# Patient Record
Sex: Female | Born: 1965 | Race: White | Hispanic: No | Marital: Married | Smoking: Current every day smoker
Health system: Southern US, Community
[De-identification: ages and names within clinical notes are randomized; demographics above are authoritative.]

---

## 2000-11-07 ENCOUNTER — Other Ambulatory Visit: Admission: RE | Admit: 2000-11-07 | Discharge: 2000-11-07 | Payer: Self-pay | Admitting: Obstetrics and Gynecology

## 2002-04-01 ENCOUNTER — Other Ambulatory Visit: Admission: RE | Admit: 2002-04-01 | Discharge: 2002-04-01 | Payer: Self-pay | Admitting: Obstetrics and Gynecology

## 2006-05-23 ENCOUNTER — Other Ambulatory Visit: Admission: RE | Admit: 2006-05-23 | Discharge: 2006-05-23 | Payer: Self-pay | Admitting: Family Medicine

## 2009-06-05 ENCOUNTER — Emergency Department (HOSPITAL_COMMUNITY): Admission: EM | Admit: 2009-06-05 | Discharge: 2009-06-05 | Payer: Self-pay | Admitting: Emergency Medicine

## 2010-06-20 ENCOUNTER — Other Ambulatory Visit (HOSPITAL_COMMUNITY)
Admission: RE | Admit: 2010-06-20 | Discharge: 2010-06-20 | Disposition: A | Discharge status: Home / Self Care | Payer: 59 | Source: Ambulatory Visit | Attending: Family Medicine | Admitting: Family Medicine

## 2010-06-20 ENCOUNTER — Other Ambulatory Visit: Payer: Self-pay | Admitting: Family Medicine

## 2010-06-20 DIAGNOSIS — Z124 Encounter for screening for malignant neoplasm of cervix: Secondary | ICD-10-CM | POA: Insufficient documentation

## 2012-07-15 ENCOUNTER — Other Ambulatory Visit: Payer: Self-pay | Admitting: Family Medicine

## 2012-07-15 ENCOUNTER — Other Ambulatory Visit (HOSPITAL_COMMUNITY)
Admission: RE | Admit: 2012-07-15 | Discharge: 2012-07-15 | Disposition: A | Discharge status: Home / Self Care | Payer: 59 | Source: Ambulatory Visit | Attending: Family Medicine | Admitting: Family Medicine

## 2012-07-15 DIAGNOSIS — Z124 Encounter for screening for malignant neoplasm of cervix: Secondary | ICD-10-CM | POA: Insufficient documentation

## 2012-07-15 DIAGNOSIS — Z1151 Encounter for screening for human papillomavirus (HPV): Secondary | ICD-10-CM | POA: Insufficient documentation

## 2012-08-04 ENCOUNTER — Other Ambulatory Visit: Payer: Self-pay | Admitting: Obstetrics and Gynecology

## 2016-11-20 ENCOUNTER — Other Ambulatory Visit: Payer: Self-pay | Admitting: Obstetrics and Gynecology

## 2016-11-20 ENCOUNTER — Other Ambulatory Visit (HOSPITAL_COMMUNITY)
Admission: RE | Admit: 2016-11-20 | Discharge: 2016-11-20 | Disposition: A | Discharge status: Home / Self Care | Payer: BLUE CROSS/BLUE SHIELD | Source: Ambulatory Visit | Attending: Obstetrics and Gynecology | Admitting: Obstetrics and Gynecology

## 2016-11-20 DIAGNOSIS — Z30433 Encounter for removal and reinsertion of intrauterine contraceptive device: Secondary | ICD-10-CM | POA: Diagnosis not present

## 2016-11-20 DIAGNOSIS — Z124 Encounter for screening for malignant neoplasm of cervix: Secondary | ICD-10-CM | POA: Insufficient documentation

## 2016-11-20 DIAGNOSIS — Z01419 Encounter for gynecological examination (general) (routine) without abnormal findings: Secondary | ICD-10-CM | POA: Diagnosis not present

## 2016-11-22 LAB — CYTOLOGY - PAP
Diagnosis: NEGATIVE
HPV (WINDOPATH): NOT DETECTED

## 2016-12-12 ENCOUNTER — Other Ambulatory Visit: Payer: Self-pay | Admitting: Obstetrics and Gynecology

## 2016-12-12 DIAGNOSIS — Z1231 Encounter for screening mammogram for malignant neoplasm of breast: Secondary | ICD-10-CM

## 2016-12-20 ENCOUNTER — Ambulatory Visit
Admission: RE | Admit: 2016-12-20 | Discharge: 2016-12-20 | Disposition: A | Discharge status: Home / Self Care | Payer: BLUE CROSS/BLUE SHIELD | Source: Ambulatory Visit | Attending: Obstetrics and Gynecology | Admitting: Obstetrics and Gynecology

## 2016-12-20 ENCOUNTER — Encounter: Payer: Self-pay | Admitting: Radiology

## 2016-12-20 DIAGNOSIS — Z1231 Encounter for screening mammogram for malignant neoplasm of breast: Secondary | ICD-10-CM | POA: Diagnosis not present

## 2017-01-04 DIAGNOSIS — Z30431 Encounter for routine checking of intrauterine contraceptive device: Secondary | ICD-10-CM | POA: Diagnosis not present

## 2017-07-16 DIAGNOSIS — Z13220 Encounter for screening for lipoid disorders: Secondary | ICD-10-CM | POA: Diagnosis not present

## 2017-07-16 DIAGNOSIS — Z0000 Encounter for general adult medical examination without abnormal findings: Secondary | ICD-10-CM | POA: Diagnosis not present

## 2017-07-23 ENCOUNTER — Encounter: Payer: Self-pay | Admitting: Obstetrics and Gynecology

## 2017-07-23 DIAGNOSIS — R739 Hyperglycemia, unspecified: Secondary | ICD-10-CM | POA: Diagnosis not present

## 2017-08-08 ENCOUNTER — Encounter: Payer: BLUE CROSS/BLUE SHIELD | Attending: Obstetrics and Gynecology

## 2017-08-08 DIAGNOSIS — R7303 Prediabetes: Secondary | ICD-10-CM

## 2017-08-08 DIAGNOSIS — Z6828 Body mass index (BMI) 28.0-28.9, adult: Secondary | ICD-10-CM | POA: Insufficient documentation

## 2017-08-08 DIAGNOSIS — Z713 Dietary counseling and surveillance: Secondary | ICD-10-CM | POA: Insufficient documentation

## 2017-08-08 NOTE — Patient Instructions (Addendum)
-   Great job with activity, keep it up and incorporate new exercises as desired - Try frozen vegetable packs, make a side for yourself at meal time - Create a balanced plate, incorporating all food groups - Try hard boiled "baked" eggs in the oven. 350 degrees for 20-30 minutes, then put in ice bath.  - Slow down with meal times, enjoy your food and do not eat past capacity - Add more water throughout your day - Attempt to make 1/2 of grain choices whole grain  - Choose heart-healthy unsaturated fats. Limit saturated fats, trans fats, and cholesterol intake.

## 2017-08-08 NOTE — Progress Notes (Signed)
Medical Nutrition Therapy:  Appt start time: 0800 end time:  0845.  Assessment:  Primary concerns today: Pt referred for prediabetes. Pt is not currently on medication and hopes to stay that way. Pt with high glucose and cholesterol lab values and interested in ways to improve overall health. Pt has started to incorporate more activity into her days but sometimes lacks motivation. Pt states her husband is picky so sometimes impacts what she is able to eat at meal times. Pt's children have been asking her to stop drinking Diet Coke and plan to get her a Fitbit to increase her activity level.   Preferred Learning Style:   No preference indicated   Learning Readiness:  Ready  Change in progress  MEDICATIONS: None recorded   DIETARY INTAKE:  Usual eating pattern includes 3 meals and 1-2 snacks per day.  Everyday foods include bread.  Avoided foods include N/A.    24-hr recall:  B ( AM): 2 pieces white toast with peanut butter Snk ( AM): None reported   L ( PM): Leftover ham with potato salad Snk ( PM): None reported D ( PM): 6 oz steak and salad (ranch, tomato, cucumber, onion, bacon bits) Snk ( PM): 2 handfuls of pretzels  (10:00 PM) Beverages: 3 Diet Cokes, 2 bottles of water, occasional beer on weekends  Usual physical activity: walk a mile at least 4 times per week  Progress Towards Goal(s):  In progress.   Nutritional Diagnosis:  NB-1.1 Food and nutrition-related knowledge deficit As related to limited prior knowledge.  As evidenced by pt report, new diagnosed prediabetes.    Intervention:  Nutrition education provided. Discussed what diabetes is and the effect it has on overall health. Described the three types of diabetes, pt has hx of gestational diabetes. Promoted well balanced meals and slowing down at meal times. Offered a few recipe ideas. Discussed ways to lower cholesterol levels. Encouraged ongoing exercise for overall health.   Tips: - Great job with activity,  keep it up and incorporate new exercises as desired - Try frozen vegetable packs, make a side for yourself at meal time - Create a balanced plate, incorporating all food groups - Slow down with meal times - Add more water throughout your day - Attempt to make 1/2 of grain choices whole grain  - Choose heart-healthy unsaturated fats. Limit saturated fats, trans fats, and cholesterol intake.  Teaching Method Utilized: Visual Auditory  Handouts given during visit include:  Living Well booklet  MyPlate handout  Yellow card   Barriers to learning/adherence to lifestyle change: N/A  Demonstrated degree of understanding via:  Teach Back   Monitoring/Evaluation:  Dietary intake, exercise, and body weight prn.

## 2017-11-11 ENCOUNTER — Other Ambulatory Visit: Payer: Self-pay | Admitting: Obstetrics and Gynecology

## 2017-11-11 DIAGNOSIS — Z1231 Encounter for screening mammogram for malignant neoplasm of breast: Secondary | ICD-10-CM

## 2017-12-26 DIAGNOSIS — Z01419 Encounter for gynecological examination (general) (routine) without abnormal findings: Secondary | ICD-10-CM | POA: Diagnosis not present

## 2018-01-07 ENCOUNTER — Ambulatory Visit
Admission: RE | Admit: 2018-01-07 | Discharge: 2018-01-07 | Disposition: A | Discharge status: Home / Self Care | Payer: BLUE CROSS/BLUE SHIELD | Source: Ambulatory Visit | Attending: Obstetrics and Gynecology | Admitting: Obstetrics and Gynecology

## 2018-01-07 DIAGNOSIS — Z1231 Encounter for screening mammogram for malignant neoplasm of breast: Secondary | ICD-10-CM

## 2019-02-03 ENCOUNTER — Other Ambulatory Visit: Payer: Self-pay

## 2019-02-03 ENCOUNTER — Ambulatory Visit: Payer: 59 | Attending: Family Medicine | Admitting: Physical Therapy

## 2019-02-03 ENCOUNTER — Encounter: Payer: Self-pay | Admitting: Physical Therapy

## 2019-02-03 DIAGNOSIS — R293 Abnormal posture: Secondary | ICD-10-CM | POA: Diagnosis present

## 2019-02-03 DIAGNOSIS — G8929 Other chronic pain: Secondary | ICD-10-CM | POA: Diagnosis present

## 2019-02-03 DIAGNOSIS — M6281 Muscle weakness (generalized): Secondary | ICD-10-CM | POA: Diagnosis present

## 2019-02-03 DIAGNOSIS — M25612 Stiffness of left shoulder, not elsewhere classified: Secondary | ICD-10-CM | POA: Diagnosis present

## 2019-02-03 DIAGNOSIS — M25512 Pain in left shoulder: Secondary | ICD-10-CM | POA: Diagnosis not present

## 2019-02-03 NOTE — Patient Instructions (Signed)
Access Code: [email or web address]  URL: [email or web address]  Date: 02/03/2019  Prepared by: Jari Favre   Exercises  Seated Scapular Retraction - 10 reps - 3 sets - 5 sec hold - 1x daily - 7x weekly  Patient Education  Office Posture

## 2019-02-03 NOTE — Therapy (Addendum)
[facility] [address], [place], [place], [address] Phone: [phone]   Fax:  [phone]  Physical Therapy Evaluation  Patient Details  Name: Heather Ford MRN: [id number] Date of Birth: 07-22-1965 Referring Provider (PT): Lujean Amel, MD   Encounter Date: 02/03/2019  PT End of Session - 02/03/19 0925    Visit Number  1    Date for PT Re-Evaluation  03/31/19    PT Start Time  1103    PT Stop Time  0923    PT Time Calculation (min)  36 min    Activity Tolerance  Patient tolerated treatment well;Patient limited by pain    Behavior During Therapy  [facility] for tasks assessed/performed       History reviewed. No pertinent past medical history.  History reviewed. No pertinent surgical history.  There were no vitals filed for this visit.   Subjective Assessment - 02/03/19 0856    Subjective  Pt states she has had pain that has gotten worse over the last couple month.    Limitations  Lifting;House hold activities;Other (comment)    Patient Stated Goals  Be able to reach and not have pain    Currently in Pain?  Yes   when lifting arm   Pain Score  5    7/10 mornings and evenings   Pain Location  Shoulder    Pain Orientation  Left;Proximal    Pain Descriptors / Indicators  Shooting;Sharp    Pain Type  Chronic pain    Pain Radiating Towards  down lateral arm into the elbow sometimes    Pain Onset  More than a month ago    Pain Frequency  Intermittent    Aggravating Factors   reaching in any direction back or overhead    Pain Relieving Factors  advil/tylenol or roll on biofreeze type of thing    Effect of Pain on Daily Activities  able to do everything but shoulder is aggravated    Multiple Pain Sites  No         [facility] PT Assessment - 02/03/19 0001      Assessment   Medical Diagnosis  M25.512 (ICD-10-CM) - Pain in left shoulder    Referring Provider (PT)  Koirala, Dibas, MD    Onset Date/Surgical Date   --   over 3 months insidious onset   Hand Dominance  Right    Prior Therapy  No      Precautions   Precautions  None      Restrictions   Weight Bearing Restrictions  No      Balance Screen   Has the patient fallen in the past 6 months  No      [place] residence    Living Arrangements  Spouse/significant other      Prior Function   Level of Independence  Independent    Vocation  Full time employment    Vocation Requirements  works from home, 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      Cognition   Overall Cognitive Status  Within Functional Limits for tasks assessed      Observation/Other Assessments   Focus on Therapeutic Outcomes (FOTO)   40% limited      Posture/Postural Control   Posture/Postural Control  Postural limitations    Postural Limitations  Rounded Shoulders;Increased thoracic kyphosis      ROM / Strength   AROM / PROM / Strength  AROM;Strength      AROM   AROM Assessment Site  Shoulder    Right/Left Shoulder  Right;Left    Right Shoulder Extension  79 Degrees    Right Shoulder Flexion  155 Degrees    Right Shoulder ABduction  155 Degrees    Right Shoulder Internal Rotation  --   lateral hip   Left Shoulder Extension  56 Degrees    Left Shoulder Flexion  130 Degrees    Left Shoulder ABduction  96 Degrees    Left Shoulder Internal Rotation  --   T6/7     Strength   Overall Strength Comments  Lt shoulder abd 3/5 +pain, IR 4-/5 +pain, flex 4/5 +pain      Palpation   Palpation comment  left pecs and anterior deltoid tight and tender ; A/P hypomobility      Ambulation/Gait   Gait Pattern  Within Functional Limits                Objective measurements completed on examination: See above findings.      [facility] Adult PT Treatment/Exercise - 02/03/19 0001      Self-Care   Self-Care  Other Self-Care Comments    Other Self-Care Comments   initial HEP             PT  Education - 02/03/19 0924    Education Details  Access Code: [email or web address]    Person(s) Educated  Patient    Methods  Explanation;Demonstration;Handout;Verbal cues    Comprehension  Verbalized understanding;Returned demonstration       PT Short Term Goals - 02/03/19 1401      PT SHORT TERM GOAL #1   Title  ind with initial HEP    Time  4    Period  Weeks    Status  New    Target Date  03/03/19      PT SHORT TERM GOAL #2   Title  AROM Lt shoulder flexion 140 deg and abduction 120 deg    Time  4    Period  Weeks    Status  New    Target Date  03/03/19        PT Long Term Goals - 02/03/19 1402      PT LONG TERM GOAL #1   Title  ind with advanced HEP    Time  8    Period  Weeks    Status  New    Target Date  03/31/19      PT LONG TERM GOAL #2   Title  FOTO < or = to 28% limitied.    Time  8    Period  Weeks    Status  New    Target Date  03/31/19      PT LONG TERM GOAL #3   Title  Pt will report at least 75% less pain during typical daily activities    Time  8    Period  Weeks    Status  New    Target Date  03/31/19      PT LONG TERM GOAL #4   Title  Pt will demonstrate Lt shoulder internal rotation to at least T12 in order to reach behind her to fasten her bra    Time  8    Period  Weeks    Status  New    Target Date  03/31/19      PT LONG TERM GOAL #5   Title  Pt will demonstrate  at least 4+/5 Left shoulder strength in order to safely lift boxes for job related tasks    Time  8    Period  Weeks    Status  New    Target Date  03/31/19             Plan - 02/03/19 1414    Clinical Impression Statement  Pt is friendly 53 y/o female who has had Lt shoulder pain for over 3 months, but it has recently been worsening.  Pt is right side dominent.  Pt has limited AROM as mentioned above. She has decreased strength and pain while doing Left shoulder flexion, abduction and IR.  Pt has increased rounding of shoulder and thoracic kyphosis.  Pt is positive for  shoulder Lt shoulder impingement based on Neer and Hawkins-Kennedy tests . She has muscle spasm and tenderness with palpation of RTC attachments.  pt will benefit from skilled PT to address impairments so she can safely perform her job related and house hold tasks with reduced risk of injury.    Stability/Clinical Decision Making  Evolving/Moderate complexity    Clinical Decision Making  Low    Rehab Potential  Excellent    PT Frequency  2x / week    PT Duration  8 weeks    PT Treatment/Interventions  ADLs/Self Care Home Management;Biofeedback;Cryotherapy;Electrical Stimulation;Iontophoresis 68m/ml Dexamethasone;Moist Heat;Ultrasound;Therapeutic activities;Therapeutic exercise;Manual techniques;Patient/family education;Passive range of motion;Neuromuscular re-education;Dry needling;Taping    PT Next Visit Plan  ionto if order signed (provide education handout to patient), discuss DN if appropriate, RTC and posture strengthening, scap stability    PT Home Exercise Plan  Access Code: [email or web address]: [email or web address] Date: 02/03/2019 Prepared by: JJari Favre Exercises Seated Scapular Retraction - 10 reps - 3 sets - 5 sec hold - 1x daily - 7x weekly Patient Education .Office Posture    Consulted and Agree with Plan of Care  Patient       Patient will benefit from skilled therapeutic intervention in order to improve the following deficits and impairments:  Hypomobility, Decreased strength, Decreased range of motion, Postural dysfunction, Pain, Impaired UE functional use  Visit Diagnosis: Chronic left shoulder pain  Stiffness of left shoulder, not elsewhere classified  Muscle weakness (generalized)  Abnormal posture     Problem List There are no active problems to display for this patient.   JJule Ser PT 02/03/2019, 2:24 PM  [facility] [address] [place] [address]Phone:  [phone]  Fax:  [phone] Name: Heather JOLLIFFEMRN: [id number] of Birth: 304-15-1967   PHYSICAL THERAPY DISCHARGE SUMMARY  Visits from Start of Care: 1  Current functional level related to goals / functional outcomes: See above goals   Remaining deficits: See above   Education / Equipment: HEP  Plan: Patient agrees to discharge.  Patient goals were not met. Patient is being discharged due to not returning since the last visit.  ?????    Did not return due to cost, eval only   JGustavus Bryant PT 02/24/19 9:03 AM

## 2019-02-10 ENCOUNTER — Ambulatory Visit: Payer: 59 | Admitting: Physical Therapy

## 2019-02-11 ENCOUNTER — Ambulatory Visit: Payer: 59 | Admitting: Physical Therapy

## 2019-02-16 ENCOUNTER — Encounter: Payer: 59 | Admitting: Physical Therapy

## 2019-02-17 ENCOUNTER — Other Ambulatory Visit: Payer: Self-pay | Admitting: Family Medicine

## 2019-02-17 DIAGNOSIS — Z1231 Encounter for screening mammogram for malignant neoplasm of breast: Secondary | ICD-10-CM

## 2019-02-18 ENCOUNTER — Other Ambulatory Visit: Payer: Self-pay

## 2019-02-18 ENCOUNTER — Ambulatory Visit
Admission: RE | Admit: 2019-02-18 | Discharge: 2019-02-18 | Disposition: A | Discharge status: Home / Self Care | Payer: 59 | Source: Ambulatory Visit | Attending: Family Medicine | Admitting: Family Medicine

## 2019-02-18 DIAGNOSIS — Z1231 Encounter for screening mammogram for malignant neoplasm of breast: Secondary | ICD-10-CM

## 2019-02-19 ENCOUNTER — Encounter: Payer: 59 | Admitting: Physical Therapy

## 2019-02-24 ENCOUNTER — Encounter: Payer: 59 | Admitting: Physical Therapy

## 2019-02-24 ENCOUNTER — Other Ambulatory Visit: Payer: Self-pay

## 2019-02-24 DIAGNOSIS — Z20822 Contact with and (suspected) exposure to covid-19: Secondary | ICD-10-CM

## 2019-02-25 LAB — NOVEL CORONAVIRUS, NAA: SARS-CoV-2, NAA: NOT DETECTED

## 2019-02-26 ENCOUNTER — Encounter: Payer: 59 | Admitting: Physical Therapy

## 2019-03-03 ENCOUNTER — Encounter: Payer: 59 | Admitting: Physical Therapy

## 2019-03-05 ENCOUNTER — Encounter: Payer: 59 | Admitting: Physical Therapy

## 2020-02-17 IMAGING — MG DIGITAL SCREENING BILAT W/ TOMO W/ CAD
8 series · 8 of 24 positions shown · non-contrast
Comparison: Previous exam(s).

CLINICAL DATA: Screening.

EXAM:
DIGITAL SCREENING BILATERAL MAMMOGRAM WITH TOMO AND CAD

[L CC synth-2D]
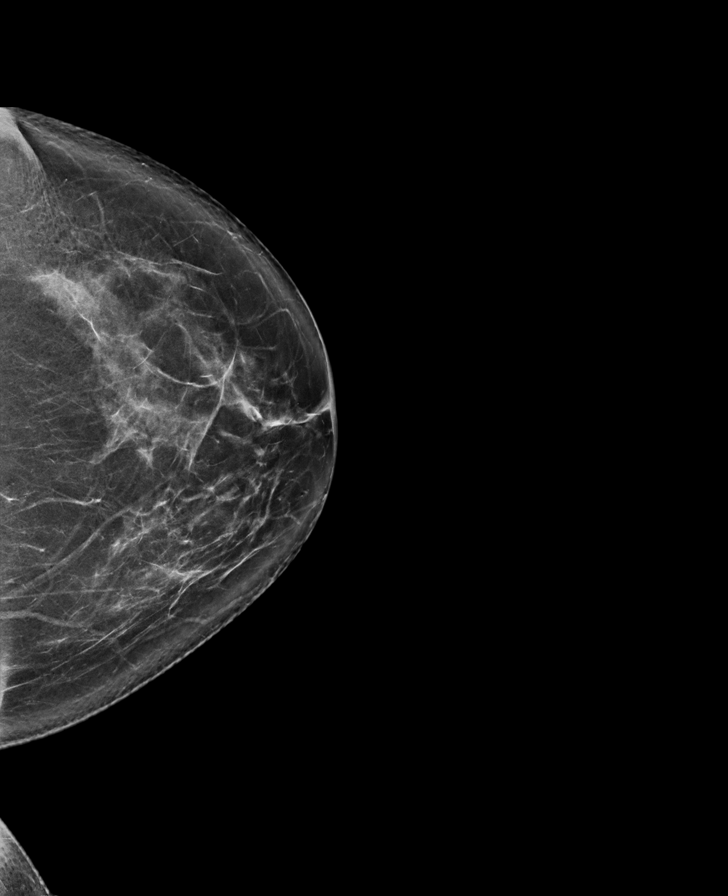

[R MLO synth-2D]
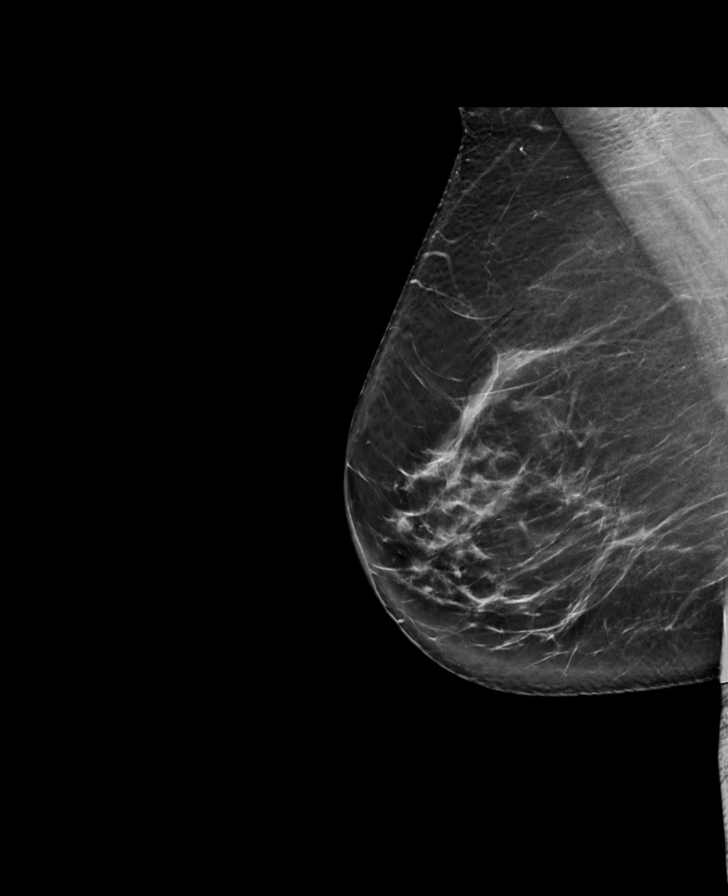

[R CC synth-2D]
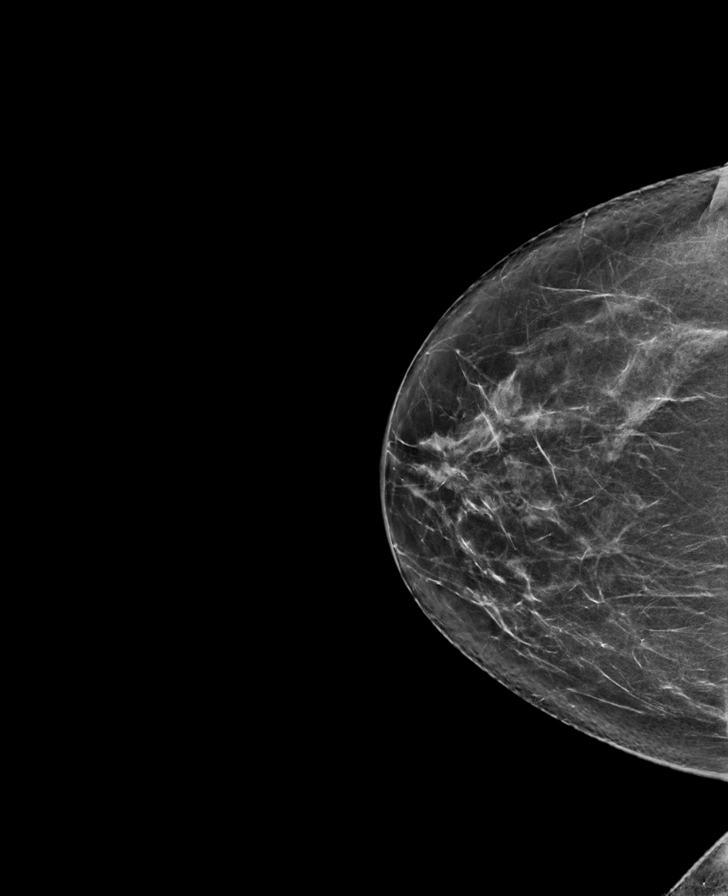

[L MLO synth-2D]
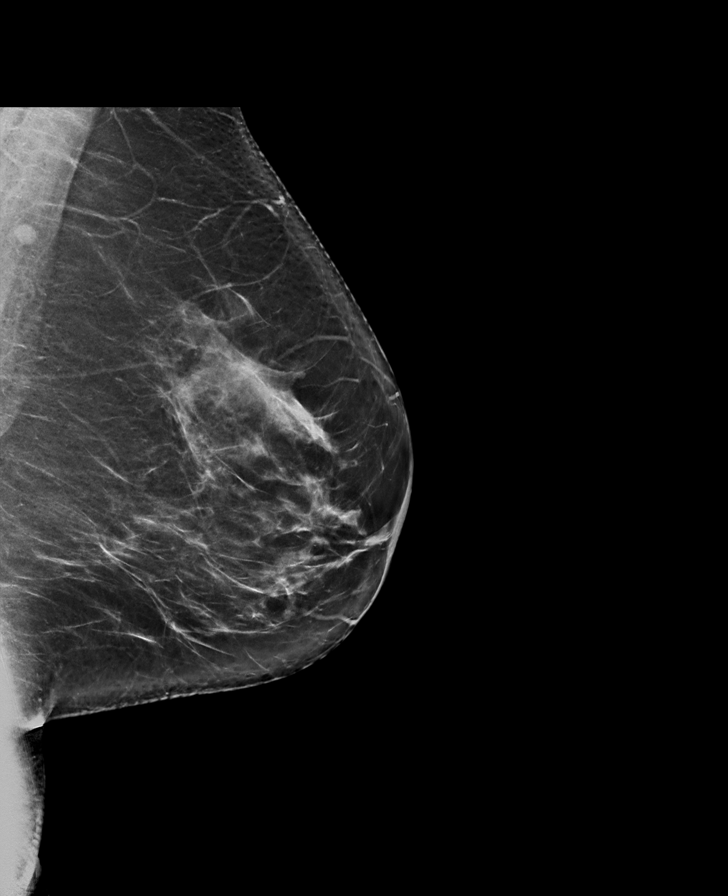

[R CC tomo · tomo slice 39/78.0]
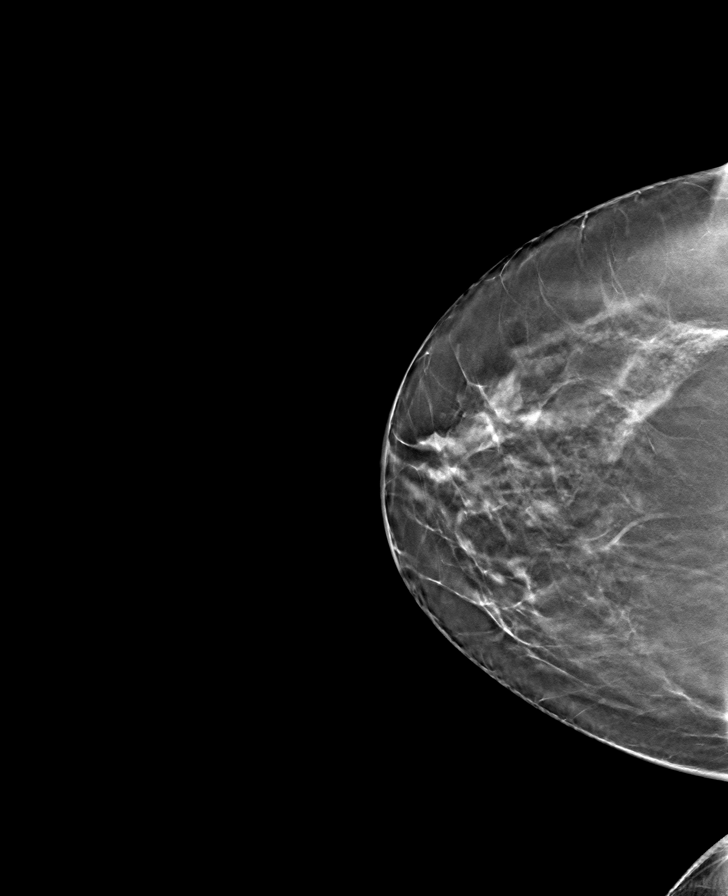

[L CC tomo · tomo slice 43/86.0]
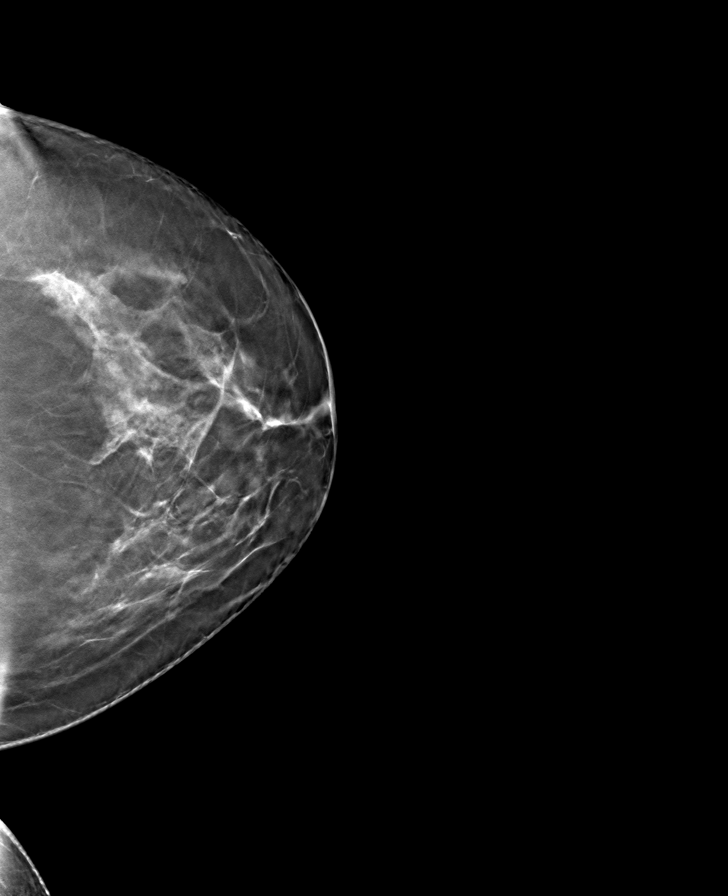

[L MLO tomo · tomo slice 43/85.0]
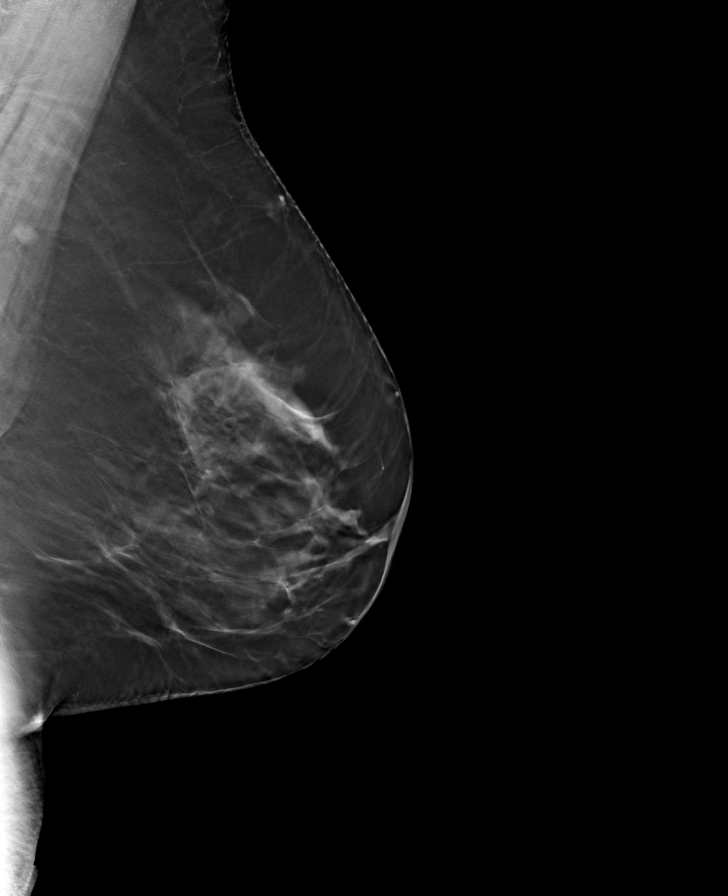

[R MLO tomo · tomo slice 45/89.0]
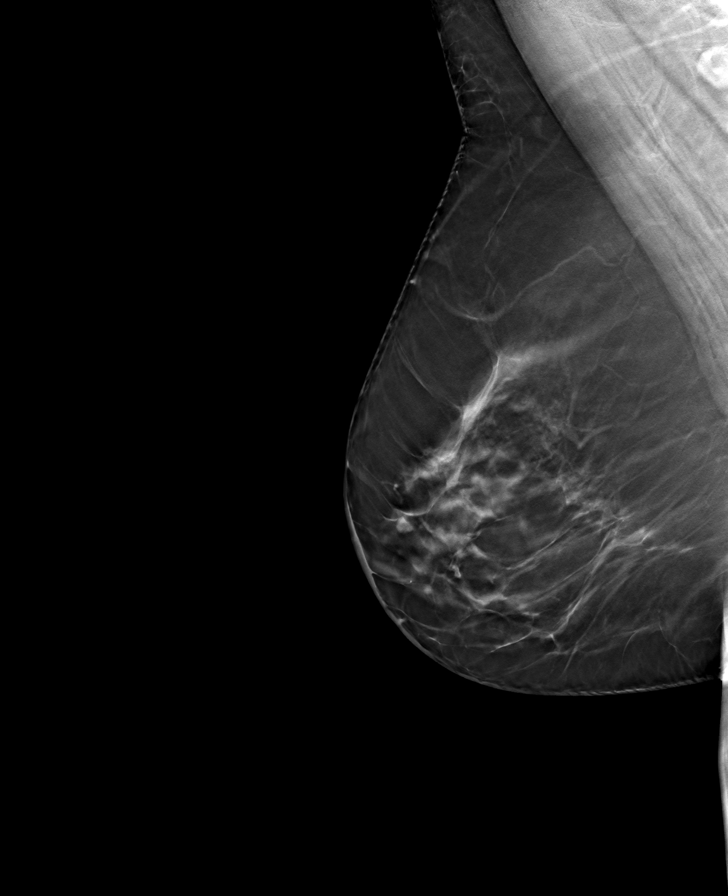

[8 of 24 positions shown; findings below may reference images not displayed]

ACR Breast Density Category b: There are scattered areas of
fibroglandular density.
FINDINGS: There are no findings suspicious for malignancy. Images were
processed with CAD.
IMPRESSION: No mammographic evidence of malignancy. A result letter of this
screening mammogram will be mailed directly to the patient.

RECOMMENDATION:
Screening mammogram in one year. (Code:[id number])

BI-RADS CATEGORY  1: Negative.

## 2021-10-02 DIAGNOSIS — E7800 Pure hypercholesterolemia, unspecified: Secondary | ICD-10-CM | POA: Diagnosis not present

## 2021-10-02 DIAGNOSIS — R7303 Prediabetes: Secondary | ICD-10-CM | POA: Diagnosis not present

## 2021-10-02 DIAGNOSIS — Z0000 Encounter for general adult medical examination without abnormal findings: Secondary | ICD-10-CM | POA: Diagnosis not present

## 2021-11-14 DIAGNOSIS — F17210 Nicotine dependence, cigarettes, uncomplicated: Secondary | ICD-10-CM | POA: Diagnosis not present

## 2021-11-14 DIAGNOSIS — L03211 Cellulitis of face: Secondary | ICD-10-CM | POA: Diagnosis not present

## 2021-11-14 DIAGNOSIS — J019 Acute sinusitis, unspecified: Secondary | ICD-10-CM | POA: Diagnosis not present

## 2022-06-28 ENCOUNTER — Other Ambulatory Visit: Payer: Self-pay | Admitting: Nurse Practitioner

## 2022-06-28 ENCOUNTER — Other Ambulatory Visit (HOSPITAL_COMMUNITY)
Admission: RE | Admit: 2022-06-28 | Discharge: 2022-06-28 | Disposition: A | Discharge status: Home / Self Care | Payer: Commercial Managed Care - HMO | Source: Ambulatory Visit | Attending: Nurse Practitioner | Admitting: Nurse Practitioner

## 2022-06-28 DIAGNOSIS — Z124 Encounter for screening for malignant neoplasm of cervix: Secondary | ICD-10-CM | POA: Insufficient documentation

## 2022-07-05 LAB — CYTOLOGY - PAP
Comment: NEGATIVE
Diagnosis: NEGATIVE
High risk HPV: NEGATIVE

## 2022-10-23 ENCOUNTER — Ambulatory Visit: Payer: Commercial Managed Care - HMO | Admitting: Podiatry

## 2022-10-23 ENCOUNTER — Encounter: Payer: Self-pay | Admitting: Podiatry

## 2022-10-23 VITALS — BP 117/60 | HR 77

## 2022-10-23 DIAGNOSIS — M21961 Unspecified acquired deformity of right lower leg: Secondary | ICD-10-CM

## 2022-10-23 DIAGNOSIS — M779 Enthesopathy, unspecified: Secondary | ICD-10-CM

## 2022-10-23 DIAGNOSIS — L84 Corns and callosities: Secondary | ICD-10-CM | POA: Diagnosis not present

## 2022-10-23 DIAGNOSIS — M21962 Unspecified acquired deformity of left lower leg: Secondary | ICD-10-CM

## 2022-10-23 NOTE — Progress Notes (Signed)
Subjective:  Patient ID: Heather Ford, female    DOB: February 18, 1966,  MRN: [id number]  Chief Complaint  Patient presents with   Callouses    "I have this hard spot on the bottom of both feet." N - hard spot L - bilateral 3rd Met. D - 10 years O - gradually gotten worse C - sharp pain, tender, sore A - walking T - Saw a Podiatrist several years ago   Patient presents today with concern of painful skin lesions on the plantar aspect of both feet.  She states this has been an ongoing issue for many years.  She gets pedicures monthly and they will shave these areas for her to give her some relief.  She also notes that she does shave them occasionally at home to.  Denies stepping on a foreign object.  States that they do not drain or bleed.  The right foot is more painful than the left.  She also has a callus on the right great toe.  PCP is Heather Rankins, MD.  No past medical history on file.  No past surgical history on file.  Allergies  Allergen Reactions   Tetanus Antitoxin     fainted    Objective:  Vitals:   10/23/22 0914  BP: 117/60  Pulse: 77    Heather Ford is a pleasant 57 y.o. female in NAD. AAO x 3.  Vascular Examination: Capillary refill time is 3-5 seconds to toes bilateral. Palpable pedal pulses b/l LE. Digital hair present b/l. No pedal edema b/l. Skin temperature gradient WNL b/l. No varicosities b/l. No cyanosis or clubbing noted b/l.   Dermatological Examination: Pedal skin with normal turgor, texture and tone b/l. No open wounds. No interdigital macerations b/l.   Hyperkeratotic lesion present, with pain on palpation, located submet 3 bilateral.  No open lesions noted.  There is also a hyperkeratotic lesion on the plantar medial aspect of the hallux IPJ.  There is a plantarflexed third metatarsal bilateral.  Neurological Examination: Protective sensation intact with Semmes-Weinstein 10 gram monofilament b/l LE. Vibratory sensation intact b/l  LE.  Assessment/Plan: 1.  Callus bilateral. 2.  Metatarsal deformity bilateral.   DG FOOT COMPLETE LEFT DG FOOT COMPLETE RIGHT  The hyperkeratotic lesions were sharply debrided/shaved with sterile #313 blade.  Salinocaine and Band-Aids were applied to the areas.  She will remove this later today.  The right hallux callus was also shaved uneventfully.  She will continue to get pedicures to keep this under good control.  Discussed with the patient what is causing the corns/calluses and reviewed treatment options today, including shaving the the painful lesion(s), off-loading techniques and pads, custom orthotics / shoe modifications.   Discussed custom orthotics in detail with the patient today.  She can call her insurance to find out her coverage for custom orthotics and can call the office at any time to schedule a 3D scan of her feet.  She will need the submet 3 lesions offloaded in the orthotic to help decrease pain and decrease callus buildup.  She will consider this in the fall when she goes back to closed toe shoes.  She notes her sandals are pretty comfortable for the summer.  Recommended urea 40 cream +2% salicylic acid to be applied to the skin lesions nightly for management of the calluses.  Return in about 3 months (around 01/23/2023) for callus recheck and discuss orthotics for Fall.   Clerance Lav, DPM, FACFAS [facility]     [address] [place], [place] [address]                Office [phone]  Fax [phone]

## 2023-02-04 ENCOUNTER — Ambulatory Visit (INDEPENDENT_AMBULATORY_CARE_PROVIDER_SITE_OTHER): Payer: Commercial Managed Care - HMO | Admitting: Podiatry

## 2023-02-04 DIAGNOSIS — M21961 Unspecified acquired deformity of right lower leg: Secondary | ICD-10-CM | POA: Diagnosis not present

## 2023-02-04 DIAGNOSIS — L84 Corns and callosities: Secondary | ICD-10-CM | POA: Insufficient documentation

## 2023-02-04 DIAGNOSIS — M21962 Unspecified acquired deformity of left lower leg: Secondary | ICD-10-CM | POA: Diagnosis not present

## 2023-02-04 NOTE — Progress Notes (Signed)
   Chief Complaint  Patient presents with   Callouses    Painful calluses submet 3 bilateral.  And right hallux medial border.  Also interested in discussing orthotics-  will check her insurance first to see if they will pay.     HPI: 57 y.o. female presents today for follow up of painful plantar calluses.  She got the Urea 40% cream, but hasn't stayed compliant with applying this daily.  Overall, she notes they are a little better.  She is interested in checking orthotic coverage with her insurance.    No past medical history on file.  No past surgical history on file.  Allergies  Allergen Reactions   Tetanus Antitoxin     fainted    Physical Exam: Hyperkeratotic lesions noted on the plantar medial aspect the hallux IPJ, left submet 3 and right submet 4.  There are focal corns within the calluses submet 3 and submet 4.  No open lesions are noted.  No drainage is noted.  Palpable pedal pulses bilateral.  Assessment/Plan of Care: 1. Callus of foot   2. Metatarsal deformity, left   3. Metatarsal deformity, right     Discussed clinical findings with patient today.  Patient was given the orthotic estimate form with the diagnoses March so that she can call her insurance and check on her specific coverage for these items.  If they are covered she was to proceed with them, she will call our office and make an appointment with our pedorthist to be scanned for the custom orthotics.  She will need this high pressure areas on the plantar aspect of the forefoot offloaded with the orthotics.  The hyperkeratotic lesions were shaved with sterile #313 blade today.  Salinocaine ointment and Band-Aids were applied.  She will remove this at bedtime.  Follow-up as needed   Clerance Lav, DPM, FACFAS [facility]     [address] [place], [place] [address]                Office [phone]  Fax [phone]

## 2023-05-22 ENCOUNTER — Other Ambulatory Visit: Payer: Self-pay | Admitting: Family Medicine

## 2023-05-22 DIAGNOSIS — Z1231 Encounter for screening mammogram for malignant neoplasm of breast: Secondary | ICD-10-CM

## 2023-05-24 ENCOUNTER — Other Ambulatory Visit: Payer: Self-pay | Admitting: Family Medicine

## 2023-05-24 DIAGNOSIS — N644 Mastodynia: Secondary | ICD-10-CM

## 2023-05-30 ENCOUNTER — Other Ambulatory Visit: Payer: Self-pay | Admitting: Family Medicine

## 2023-05-30 DIAGNOSIS — N644 Mastodynia: Secondary | ICD-10-CM

## 2023-06-06 ENCOUNTER — Ambulatory Visit
Admission: RE | Admit: 2023-06-06 | Discharge: 2023-06-06 | Disposition: A | Discharge status: Home / Self Care | Payer: Commercial Managed Care - HMO | Source: Ambulatory Visit | Attending: Family Medicine | Admitting: Family Medicine

## 2023-06-06 ENCOUNTER — Other Ambulatory Visit: Payer: Self-pay | Admitting: Family Medicine

## 2023-06-06 DIAGNOSIS — N644 Mastodynia: Secondary | ICD-10-CM

## 2023-06-06 DIAGNOSIS — R921 Mammographic calcification found on diagnostic imaging of breast: Secondary | ICD-10-CM

## 2023-06-13 ENCOUNTER — Ambulatory Visit
Admission: RE | Admit: 2023-06-13 | Discharge: 2023-06-13 | Disposition: A | Discharge status: Home / Self Care | Payer: Commercial Managed Care - HMO | Source: Ambulatory Visit | Attending: Family Medicine | Admitting: Family Medicine

## 2023-06-13 DIAGNOSIS — R921 Mammographic calcification found on diagnostic imaging of breast: Secondary | ICD-10-CM

## 2023-06-13 DIAGNOSIS — N644 Mastodynia: Secondary | ICD-10-CM

## 2023-06-13 HISTORY — PX: BREAST BIOPSY: SHX20

## 2023-06-14 LAB — SURGICAL PATHOLOGY
